# Patient Record
Sex: Female | Born: 1965 | Race: White | Hispanic: No | Smoking: Former smoker
Health system: Southern US, Community
[De-identification: ages and names within clinical notes are randomized; demographics above are authoritative.]

## PROBLEM LIST (undated history)

## (undated) DIAGNOSIS — T753XXA Motion sickness, initial encounter: Secondary | ICD-10-CM

## (undated) DIAGNOSIS — Z972 Presence of dental prosthetic device (complete) (partial): Secondary | ICD-10-CM

## (undated) HISTORY — PX: EYE SURGERY: SHX253

---

## 2009-03-06 ENCOUNTER — Emergency Department (HOSPITAL_BASED_OUTPATIENT_CLINIC_OR_DEPARTMENT_OTHER): Admission: EM | Admit: 2009-03-06 | Discharge: 2009-03-06 | Payer: Self-pay | Admitting: Emergency Medicine

## 2017-11-27 HISTORY — PX: EYE SURGERY: SHX253

## 2021-11-30 ENCOUNTER — Other Ambulatory Visit: Payer: Self-pay

## 2021-11-30 ENCOUNTER — Encounter: Payer: Self-pay | Admitting: Obstetrics and Gynecology

## 2021-11-30 ENCOUNTER — Ambulatory Visit (INDEPENDENT_AMBULATORY_CARE_PROVIDER_SITE_OTHER): Payer: PRIVATE HEALTH INSURANCE | Admitting: Obstetrics and Gynecology

## 2021-11-30 ENCOUNTER — Other Ambulatory Visit (HOSPITAL_COMMUNITY)
Admission: RE | Admit: 2021-11-30 | Discharge: 2021-11-30 | Disposition: A | Discharge status: Home / Self Care | Payer: PRIVATE HEALTH INSURANCE | Source: Ambulatory Visit | Attending: Obstetrics and Gynecology | Admitting: Obstetrics and Gynecology

## 2021-11-30 VITALS — BP 110/65 | HR 71 | Resp 16 | Ht 67.0 in | Wt 140.0 lb

## 2021-11-30 DIAGNOSIS — Z01419 Encounter for gynecological examination (general) (routine) without abnormal findings: Secondary | ICD-10-CM | POA: Diagnosis not present

## 2021-11-30 DIAGNOSIS — Z30432 Encounter for removal of intrauterine contraceptive device: Secondary | ICD-10-CM | POA: Diagnosis not present

## 2021-11-30 NOTE — Progress Notes (Signed)
Subjective:     Bailey Martinez is a 56 y.o. female P1 with BMI 21 who is here for a comprehensive physical exam. The patient reports no problems. She reports amenorrhea for the past 15 years. Patient reports Mirena IUD removed 15 years ago. She has not been sexually active for over 9 years. She denies pelvic pain or abnormal discharge. She denies urinary incontinence. She denies vasomotor symptoms. She reports constipation which she treats with Miralax and laxatives. Patient is without any other complaints  History reviewed. No pertinent past medical history. Past Surgical History:  Procedure Laterality Date   EYE SURGERY Left    Family History  Problem Relation Age of Onset   Kidney disease Father    Arthritis Mother     Social History   Socioeconomic History   Marital status: Divorced    Spouse name: Not on file   Number of children: Not on file   Years of education: Not on file   Highest education level: Not on file  Occupational History   Occupation: Catering  Tobacco Use   Smoking status: Former    Types: Cigarettes   Smokeless tobacco: Not on file  Vaping Use   Vaping Use: Every day   Substances: Nicotine, Flavoring  Substance and Sexual Activity   Alcohol use: Not Currently   Drug use: Never   Sexual activity: Not Currently    Birth control/protection: None  Other Topics Concern   Not on file  Social History Narrative   Not on file   Social Determinants of Health   Financial Resource Strain: Not on file  Food Insecurity: Not on file  Transportation Needs: Not on file  Physical Activity: Not on file  Stress: Not on file  Social Connections: Not on file  Intimate Partner Violence: Not on file   Health Maintenance  Topic Date Due   HIV Screening  Never done   Hepatitis C Screening  Never done   TETANUS/TDAP  Never done   PAP SMEAR-Modifier  Never done   COLONOSCOPY (Pts 45-64yrs Insurance coverage will need to be confirmed)  Never done   MAMMOGRAM  Never  done   Zoster Vaccines- Shingrix (1 of 2) Never done   COVID-19 Vaccine (4 - Booster for Pfizer series) 12/16/2020   INFLUENZA VACCINE  Never done   HPV VACCINES  Aged Out       Review of Systems Pertinent items noted in HPI and remainder of comprehensive ROS otherwise negative.   Objective:  Blood pressure 110/65, pulse 71, resp. rate 16, height 5\' 7"  (1.702 m), weight 140 lb (63.5 kg).   GENERAL: Well-developed, well-nourished female in no acute distress.  HEENT: Normocephalic, atraumatic. Sclerae anicteric.  NECK: Supple. Normal thyroid.  LUNGS: Clear to auscultation bilaterally.  HEART: Regular rate and rhythm. BREASTS: Symmetric in size. No palpable masses or lymphadenopathy, skin changes, or nipple drainage. ABDOMEN: Soft, nontender, nondistended. No organomegaly. PELVIC: Normal external female genitalia. Vagina is pink and rugated.  Normal discharge. Normal appearing cervix with IUD stings visualized at the os. Uterus is normal in size. No adnexal mass or tenderness. Chaperone present during the pelvic exam EXTREMITIES: No cyanosis, clubbing, or edema, 2+ distal pulses.     Assessment:    Healthy female exam.      Plan:    Pap smear collected Patient was unaware that IUD was still in situ for the past 15 years and wonders if it was replaced without her knowledge. IUD was removed today without complications  using a ring forceps Screening mammogram ordered Health maintenance labs ordered Patient referred to GI for colonoscopy and constipation evaluation Patient will be contacted with abnormal results See After Visit Summary for Counseling Recommendations

## 2021-11-30 NOTE — Progress Notes (Signed)
Last pap 10 years ago @ [facility] Never had mammogram

## 2021-12-01 ENCOUNTER — Encounter: Payer: Self-pay | Admitting: Obstetrics and Gynecology

## 2021-12-01 LAB — CBC
HCT: 45.9 % — ABNORMAL HIGH (ref 35.0–45.0)
Hemoglobin: 15.7 g/dL — ABNORMAL HIGH (ref 11.7–15.5)
MCH: 30.2 pg (ref 27.0–33.0)
MCHC: 34.2 g/dL (ref 32.0–36.0)
MCV: 88.3 fL (ref 80.0–100.0)
MPV: 10.1 fL (ref 7.5–12.5)
Platelets: 223 10*3/uL (ref 140–400)
RBC: 5.2 10*6/uL — ABNORMAL HIGH (ref 3.80–5.10)
RDW: 12.1 % (ref 11.0–15.0)
WBC: 5.3 10*3/uL (ref 3.8–10.8)

## 2021-12-01 LAB — LIPID PANEL
Cholesterol: 233 mg/dL — ABNORMAL HIGH (ref <200)
HDL: 88 mg/dL (ref >=50)
LDL Cholesterol (Calc): 126 mg/dL (calc) — ABNORMAL HIGH
Non-HDL Cholesterol (Calc): 145 mg/dL (calc) — ABNORMAL HIGH (ref <130)
Total CHOL/HDL Ratio: 2.6 (calc) (ref <5.0)
Triglycerides: 93 mg/dL (ref <150)

## 2021-12-01 LAB — COMPREHENSIVE METABOLIC PANEL
AG Ratio: 2 (calc) (ref 1.0–2.5)
ALT: 13 U/L (ref 6–29)
AST: 15 U/L (ref 10–35)
Albumin: 4.6 g/dL (ref 3.6–5.1)
Alkaline phosphatase (APISO): 55 U/L (ref 37–153)
BUN: 17 mg/dL (ref 7–25)
CO2: 32 mmol/L (ref 20–32)
Calcium: 10.4 mg/dL (ref 8.6–10.4)
Chloride: 103 mmol/L (ref 98–110)
Creat: 0.77 mg/dL (ref 0.50–1.03)
Globulin: 2.3 g/dL (calc) (ref 1.9–3.7)
Glucose, Bld: 97 mg/dL (ref 65–99)
Potassium: 5.1 mmol/L (ref 3.5–5.3)
Sodium: 143 mmol/L (ref 135–146)
Total Bilirubin: 1.3 mg/dL — ABNORMAL HIGH (ref 0.2–1.2)
Total Protein: 6.9 g/dL (ref 6.1–8.1)

## 2021-12-01 LAB — HEMOGLOBIN A1C
Hgb A1c MFr Bld: 5.4 % of total Hgb (ref <5.7)
Mean Plasma Glucose: 108 mg/dL
eAG (mmol/L): 6 mmol/L

## 2021-12-01 LAB — TSH: TSH: 2.58 mIU/L

## 2021-12-05 LAB — CYTOLOGY - PAP
Comment: NEGATIVE
Diagnosis: NEGATIVE
High risk HPV: NEGATIVE

## 2021-12-20 ENCOUNTER — Ambulatory Visit (INDEPENDENT_AMBULATORY_CARE_PROVIDER_SITE_OTHER): Payer: PRIVATE HEALTH INSURANCE

## 2021-12-20 ENCOUNTER — Other Ambulatory Visit: Payer: Self-pay

## 2021-12-20 DIAGNOSIS — Z01419 Encounter for gynecological examination (general) (routine) without abnormal findings: Secondary | ICD-10-CM

## 2021-12-20 DIAGNOSIS — Z1231 Encounter for screening mammogram for malignant neoplasm of breast: Secondary | ICD-10-CM

## 2022-07-18 ENCOUNTER — Encounter: Payer: Self-pay | Admitting: Ophthalmology

## 2022-07-24 NOTE — Discharge Instructions (Signed)

## 2022-07-25 ENCOUNTER — Ambulatory Visit
Admission: RE | Admit: 2022-07-25 | Discharge: 2022-07-25 | Disposition: A | Discharge status: Home / Self Care | Payer: 59 | Attending: Ophthalmology | Admitting: Ophthalmology

## 2022-07-25 ENCOUNTER — Encounter: Payer: Self-pay | Admitting: Ophthalmology

## 2022-07-25 ENCOUNTER — Ambulatory Visit: Payer: 59 | Admitting: Anesthesiology

## 2022-07-25 ENCOUNTER — Other Ambulatory Visit: Payer: Self-pay

## 2022-07-25 ENCOUNTER — Encounter
Admission: RE | Disposition: A | Discharge status: Home / Self Care | Payer: Self-pay | Source: Home / Self Care | Attending: Ophthalmology

## 2022-07-25 DIAGNOSIS — F17290 Nicotine dependence, other tobacco product, uncomplicated: Secondary | ICD-10-CM | POA: Diagnosis not present

## 2022-07-25 DIAGNOSIS — H268 Other specified cataract: Secondary | ICD-10-CM | POA: Diagnosis present

## 2022-07-25 HISTORY — PX: CATARACT EXTRACTION W/PHACO: SHX586

## 2022-07-25 HISTORY — DX: Motion sickness, initial encounter: T75.3XXA

## 2022-07-25 HISTORY — DX: Presence of dental prosthetic device (complete) (partial): Z97.2

## 2022-07-25 SURGERY — PHACOEMULSIFICATION, CATARACT, WITH IOL INSERTION
Anesthesia: Monitor Anesthesia Care | Site: Eye | Laterality: Right

## 2022-07-25 MED ORDER — SIGHTPATH DOSE#1 BSS IO SOLN
INTRAOCULAR | Status: DC | PRN
  Administered 2022-07-25: 57 mL via OPHTHALMIC

## 2022-07-25 MED ORDER — FENTANYL CITRATE (PF) 100 MCG/2ML IJ SOLN
INTRAMUSCULAR | Status: DC | PRN
  Administered 2022-07-25: 50 ug via INTRAVENOUS
  Administered 2022-07-25 (×2): 25 ug via INTRAVENOUS

## 2022-07-25 MED ORDER — ARMC OPHTHALMIC DILATING DROPS
1.0000 | OPHTHALMIC | Status: DC | PRN
  Administered 2022-07-25 (×3): 1 via OPHTHALMIC

## 2022-07-25 MED ORDER — TETRACAINE HCL 0.5 % OP SOLN
1.0000 [drp] | OPHTHALMIC | Status: DC | PRN
  Administered 2022-07-25 (×3): 1 [drp] via OPHTHALMIC

## 2022-07-25 MED ORDER — BRIMONIDINE TARTRATE-TIMOLOL 0.2-0.5 % OP SOLN
OPHTHALMIC | Status: DC | PRN
  Administered 2022-07-25: 1 [drp] via OPHTHALMIC

## 2022-07-25 MED ORDER — TRYPAN BLUE 0.06 % IO SOSY
PREFILLED_SYRINGE | INTRAOCULAR | Status: DC | PRN
  Administered 2022-07-25: 0.5 mL via INTRAOCULAR

## 2022-07-25 MED ORDER — MIDAZOLAM HCL 2 MG/2ML IJ SOLN
INTRAMUSCULAR | Status: DC | PRN
  Administered 2022-07-25: 1 mg via INTRAVENOUS

## 2022-07-25 MED ORDER — SIGHTPATH DOSE#1 BSS IO SOLN
INTRAOCULAR | Status: DC | PRN
  Administered 2022-07-25: 15 mL via INTRAOCULAR

## 2022-07-25 MED ORDER — LACTATED RINGERS IV SOLN: INTRAVENOUS | Status: DC

## 2022-07-25 MED ORDER — ONDANSETRON HCL 4 MG/2ML IJ SOLN
INTRAMUSCULAR | Status: DC | PRN
  Administered 2022-07-25: 4 mg via INTRAVENOUS

## 2022-07-25 MED ORDER — SIGHTPATH DOSE#1 NA HYALUR & NA CHOND-NA HYALUR IO KIT
PACK | INTRAOCULAR | Status: DC | PRN
  Administered 2022-07-25: 1 via OPHTHALMIC

## 2022-07-25 MED ORDER — SODIUM HYALURONATE 23MG/ML IO SOSY
PREFILLED_SYRINGE | INTRAOCULAR | Status: DC | PRN
  Administered 2022-07-25: 0.6 mL via INTRAOCULAR

## 2022-07-25 MED ORDER — SIGHTPATH DOSE#1 BSS IO SOLN
INTRAOCULAR | Status: DC | PRN
  Administered 2022-07-25: 2 mL

## 2022-07-25 MED ORDER — CEFUROXIME OPHTHALMIC INJECTION 1 MG/0.1 ML
INJECTION | OPHTHALMIC | Status: DC | PRN
  Administered 2022-07-25: 0.1 mL via INTRACAMERAL

## 2022-07-25 SURGICAL SUPPLY — 17 items
CANNULA ANT/CHMB 27G (MISCELLANEOUS) IMPLANT
CANNULA ANT/CHMB 27GA (MISCELLANEOUS) ×1 IMPLANT
CATARACT SUITE SIGHTPATH (MISCELLANEOUS) ×1 IMPLANT
FEE CATARACT SUITE SIGHTPATH (MISCELLANEOUS) ×1 IMPLANT
GLOVE SRG 8 PF TXTR STRL LF DI (GLOVE) ×1 IMPLANT
GLOVE SURG ENC TEXT LTX SZ7.5 (GLOVE) ×1 IMPLANT
GLOVE SURG UNDER POLY LF SZ8 (GLOVE) ×1
LENS CLAREON WAGON WHEEL 21.5 (Intraocular Lens) ×1 IMPLANT
LENS IOL CLRN WGN WHL 21.5 (Intraocular Lens) IMPLANT
NDL FILTER BLUNT 18X1 1/2 (NEEDLE) ×1 IMPLANT
NEEDLE FILTER BLUNT 18X1 1/2 (NEEDLE) ×1 IMPLANT
SOL BAL SALT 15ML (MISCELLANEOUS) ×1
SOLUTION BAL SALT 15ML (MISCELLANEOUS) IMPLANT
SUT ETHILON 10-0 CS-B-6CS-B-6 (SUTURE)
SUTURE EHLN 10-0 CS-B-6CS-B-6 (SUTURE) IMPLANT
SYR 3ML LL SCALE MARK (SYRINGE) ×1 IMPLANT
WATER STERILE IRR 250ML POUR (IV SOLUTION) ×1 IMPLANT

## 2022-07-25 NOTE — Anesthesia Postprocedure Evaluation (Signed)
Anesthesia Post Note  Patient: Cheray Pardi  Procedure(s) Performed: CATARACT EXTRACTION PHACO AND INTRAOCULAR LENS PLACEMENT (IOC) RIGHT VISION BLUE, HEALON 5 6.35 00:48.1 (Right: Eye)  Patient location during evaluation: PACU Anesthesia Type: MAC Level of consciousness: awake and alert, oriented and patient cooperative Pain management: pain level controlled Vital Signs Assessment: post-procedure vital signs reviewed and stable Respiratory status: spontaneous breathing, nonlabored ventilation and respiratory function stable Cardiovascular status: blood pressure returned to baseline and stable Postop Assessment: adequate PO intake Anesthetic complications: no   There were no known notable events for this encounter.   Last Vitals:  Vitals:   07/25/22 1346 07/25/22 1352  BP: 113/88 (!) 127/90  Pulse:  78  Resp:  11  Temp: (!) 36.3 C (!) 36.3 C  SpO2:  96%    Last Pain:  Vitals:   07/25/22 1352  PainSc: 0-No pain                 Darrin Nipper

## 2022-07-25 NOTE — Transfer of Care (Signed)
Immediate Anesthesia Transfer of Care Note  Patient: Bailey Martinez  Procedure(s) Performed: CATARACT EXTRACTION PHACO AND INTRAOCULAR LENS PLACEMENT (IOC) RIGHT VISION BLUE, HEALON 5 6.35 00:48.1 (Right: Eye)  Patient Location: PACU  Anesthesia Type: MAC  Level of Consciousness: awake, alert  and patient cooperative  Airway and Oxygen Therapy: Patient Spontanous Breathing and Patient connected to supplemental oxygen  Post-op Assessment: Post-op Vital signs reviewed, Patient's Cardiovascular Status Stable, Respiratory Function Stable, Patent Airway and No signs of Nausea or vomiting  Post-op Vital Signs: Reviewed and stable  Complications: There were no known notable events for this encounter.

## 2022-07-25 NOTE — Op Note (Signed)
OPERATIVE NOTE  Bailey Martinez [id number] 07/25/2022   PREOPERATIVE DIAGNOSIS:  H25.11 Cataract            Mature (Total) Cataract Right Eye H25.89   POSTOPERATIVE DIAGNOSIS: same          PROCEDURE:  Phacoemusification with posterior chamber intraocular lens placement of the right eye .  Vision Blue dye was used to stain the lens capsule.  LENS:   Implant Name Type Inv. Item Serial No. Manufacturer Lot No. LRB No. Used Action  LENS CLAREON WAGON WHEEL 21.5 - [id number] Intraocular Lens LENS CLAREON WAGON WHEEL 21.5 [id number] [employer]  Right 1 Implanted      [id number]  ULTRASOUND TIME:  0 minutes 48 seconds, CDE 6.4  SURGEON:  Wyonia Hough, MD   ANESTHESIA:  Topical with tetracaine drops augmented with 1% preservative-free intracameral lidocaine.   COMPLICATIONS:  None.   DESCRIPTION OF PROCEDURE: The patient was identified in the holding room and transported to the operating room and placed in the supine position under the operating microscope.  The right eye was identified as the operative eye and it was prepped and draped in the usual sterile ophthalmic fashion.  A 1 millimeter clear-corneal paracentesis was made at the 12:00 position.  0.5 ml of preservative-free 1% lidocaine was injected into the anterior chamber. The anterior chamber was filled with Healon 5 viscoelastic.    Vision Blue dye was then injected under the viscoelastic to stain the lens capsule.  BSS was then used to wash the dye out.  Additional Healon 5 was placed into the anterior chamber. A 2.4 millimeter keratome was used to make a near-clear corneal incision at the 9:00 position. A curvilinear capsulorrhexis was made with a cystotome and capsulorrhexis forceps.  Balanced salt solution was used to hydrodissect and hydrodelineate the nucleus.  Viscoat was then placed in the anterior chamber.   Phacoemulsification was then used in stop and chop fashion to remove the lens nucleus and epinucleus.  The  remaining cortex was then removed using the irrigation and aspiration handpiece. Provisc was then placed into the capsular bag to distend it for lens placement.  A 21.5 -diopter lens was then injected into the capsular bag.  The remaining viscoelastic was aspirated.   Wounds were hydrated with balanced salt solution.  The anterior chamber was inflated to a physiologic pressure with balanced salt solution. Cefuroxime 0.1 ml of a 10mg /ml solution was injected into the anterior chamber for a dose of 1 mg of intracameral antibiotic at the completion of the case.  No wound leaks were noted.  Topical Timolol and Brimonidine drops were applied to the eye.  The patient was taken to the recovery room in stable condition without complications of anesthesia or surgery.  Gillian Kluever 07/25/2022, 1:44 PM

## 2022-07-25 NOTE — Anesthesia Preprocedure Evaluation (Addendum)
Anesthesia Evaluation  Patient identified by MRN, date of birth, ID band Patient awake    Reviewed: Allergy & Precautions, NPO status , Patient's Chart, lab work & pertinent test results  History of Anesthesia Complications Negative for: history of anesthetic complications  Airway Mallampati: I   Neck ROM: Full    Dental  (+) Partial Lower   Pulmonary former smoker (quit 2019),    Pulmonary exam normal breath sounds clear to auscultation       Cardiovascular Exercise Tolerance: Good negative cardio ROS Normal cardiovascular exam Rhythm:Regular Rate:Normal     Neuro/Psych Uses kratom    GI/Hepatic negative GI ROS,   Endo/Other  negative endocrine ROS  Renal/GU negative Renal ROS     Musculoskeletal   Abdominal   Peds  Hematology negative hematology ROS (+)   Anesthesia Other Findings   Reproductive/Obstetrics                            Anesthesia Physical Anesthesia Plan  ASA: 2  Anesthesia Plan: MAC   Post-op Pain Management:    Induction: Intravenous  PONV Risk Score and Plan: 2 and Treatment may vary due to age or medical condition, Midazolam and TIVA  Airway Management Planned: Natural Airway and Nasal Cannula  Additional Equipment:   Intra-op Plan:   Post-operative Plan:   Informed Consent: I have reviewed the patients History and Physical, chart, labs and discussed the procedure including the risks, benefits and alternatives for the proposed anesthesia with the patient or authorized representative who has indicated his/her understanding and acceptance.     Dental advisory given  Plan Discussed with: CRNA  Anesthesia Plan Comments: (LMA/GETA backup discussed.  Patient consented for risks of anesthesia including but not limited to:  - adverse reactions to medications - damage to eyes, teeth, lips or other oral mucosa - nerve damage due to positioning  - sore  throat or hoarseness - damage to heart, brain, nerves, lungs, other parts of body or loss of life  Informed patient about role of CRNA in peri- and intra-operative care.  Patient voiced understanding.)        Anesthesia Quick Evaluation
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
## 2022-07-26 ENCOUNTER — Encounter: Payer: Self-pay | Admitting: Ophthalmology

## 2023-10-31 IMAGING — MG MM DIGITAL SCREENING BILAT W/ TOMO AND CAD
8 series · 9 of 24 positions shown · non-contrast
Comparison: None.

CLINICAL DATA: Screening.

EXAM:
DIGITAL SCREENING BILATERAL MAMMOGRAM WITH TOMOSYNTHESIS AND CAD
TECHNIQUE: Bilateral screening digital craniocaudal and mediolateral oblique
mammograms were obtained. Bilateral screening digital breast
tomosynthesis was performed. The images were evaluated with
computer-aided detection.

[R MLO synth-2D]
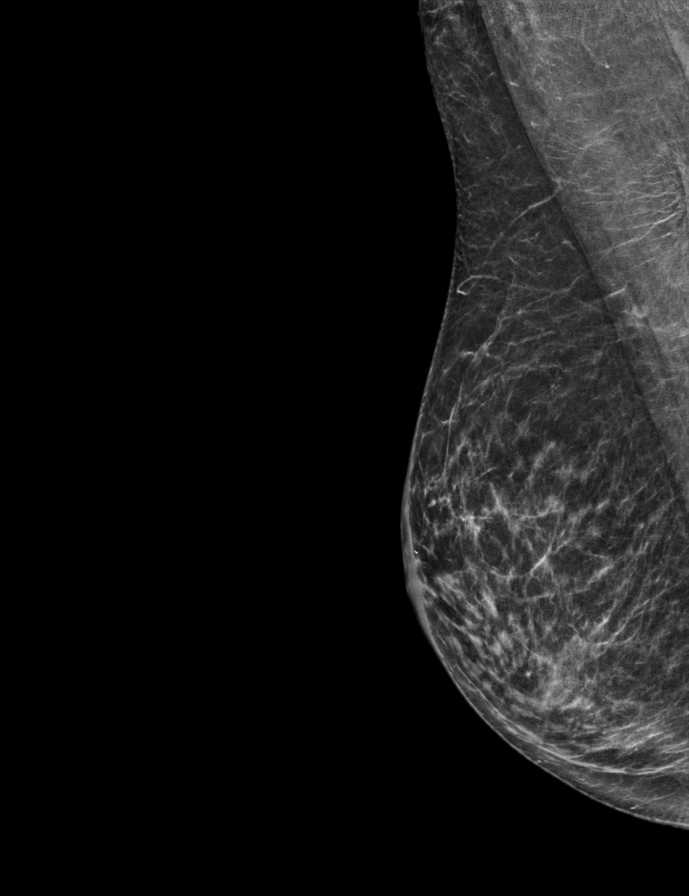

[L MLO synth-2D]
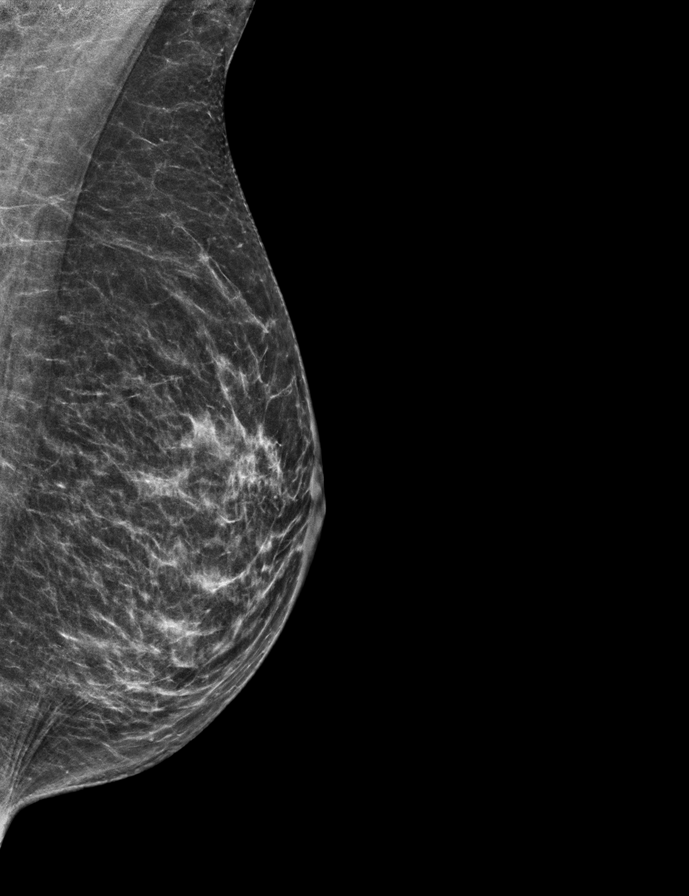

[L CC synth-2D]
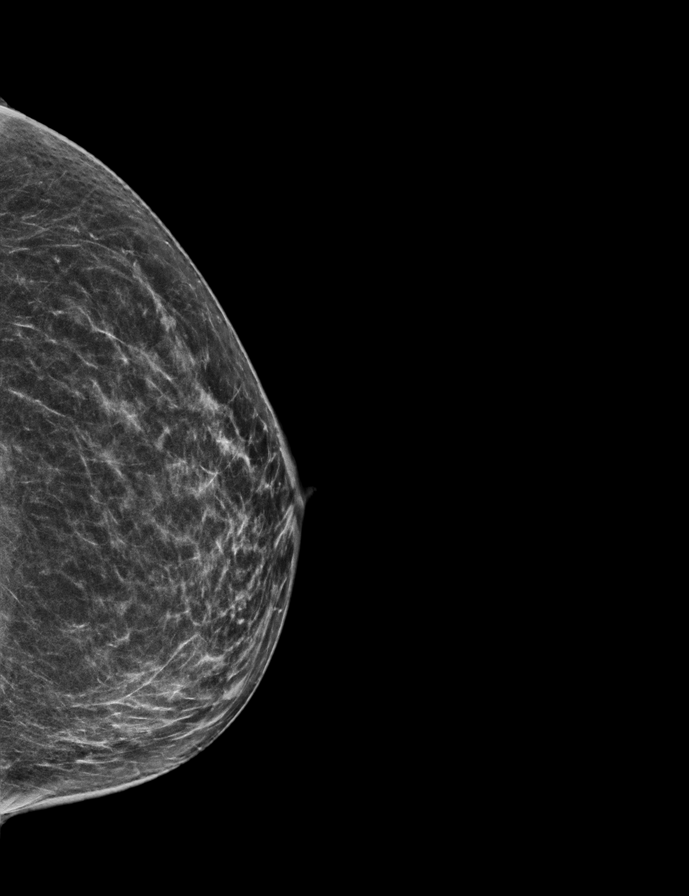

[R CC synth-2D]
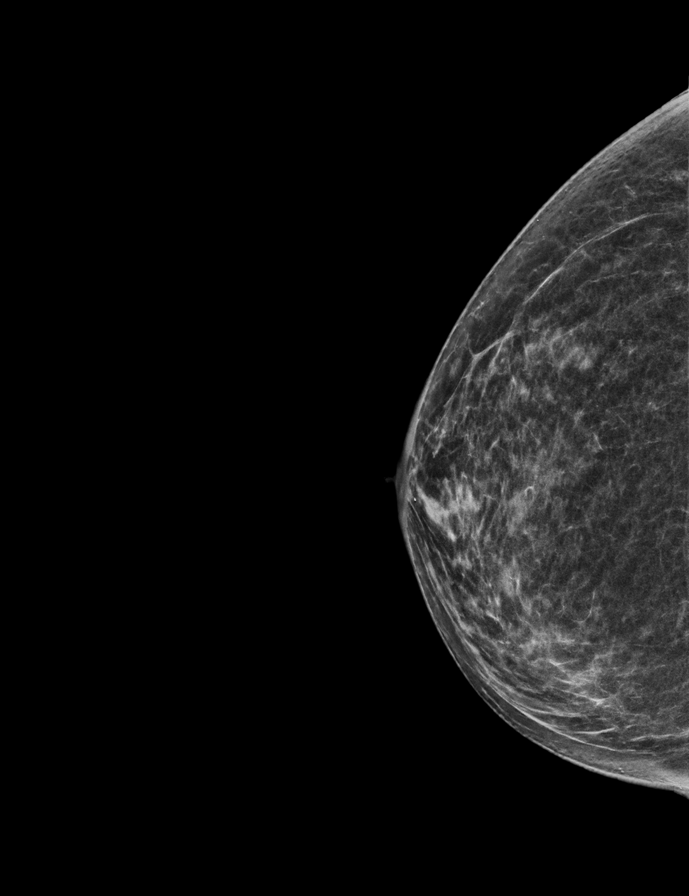

[R CC tomo · 2 of 55 frames shown]
[frame 18/55]
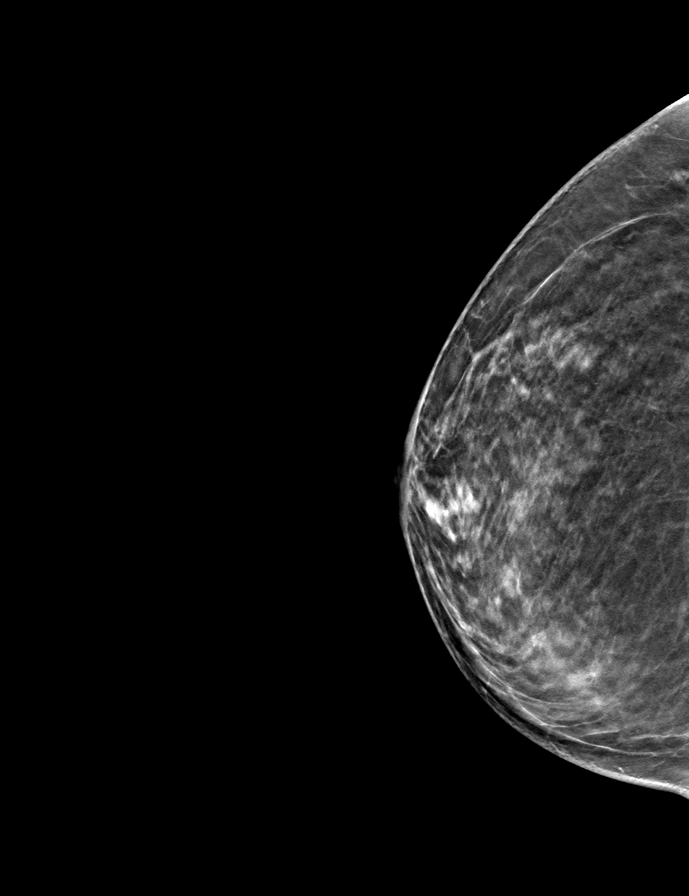
[frame 28/55]
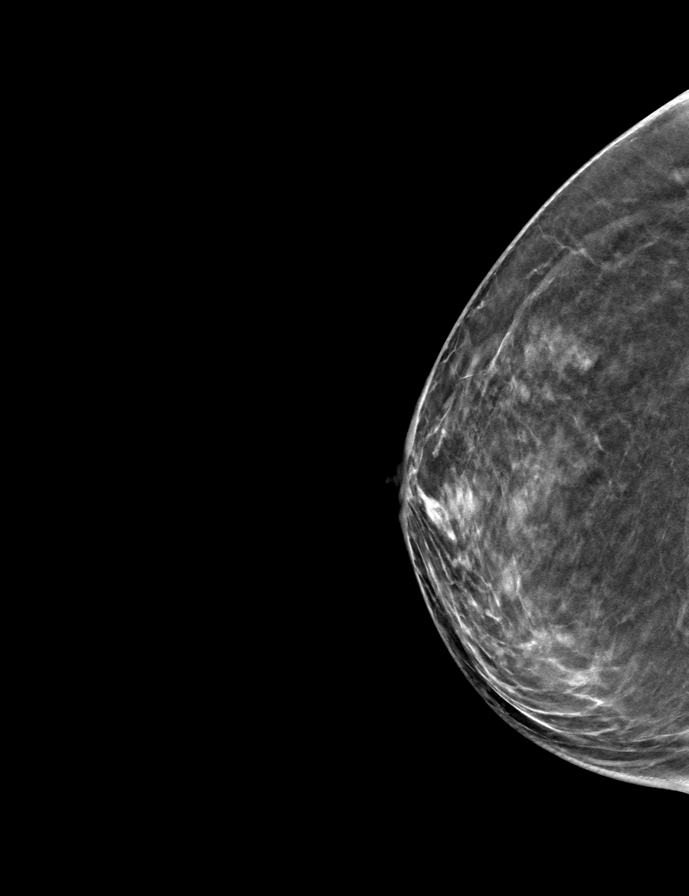

[L CC tomo · tomo slice 28/55.0]
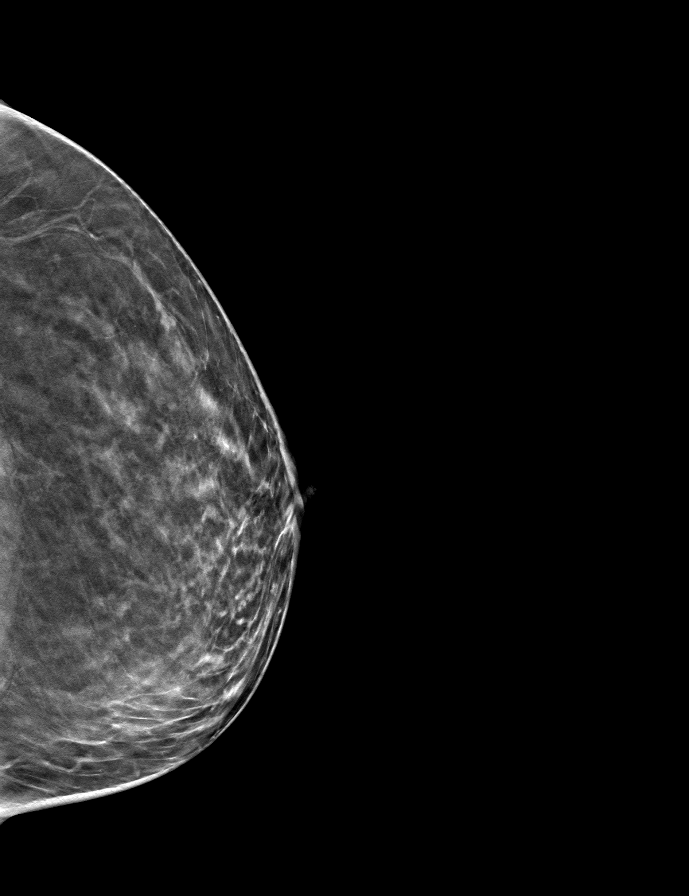

[R MLO tomo · tomo slice 27/54.0]
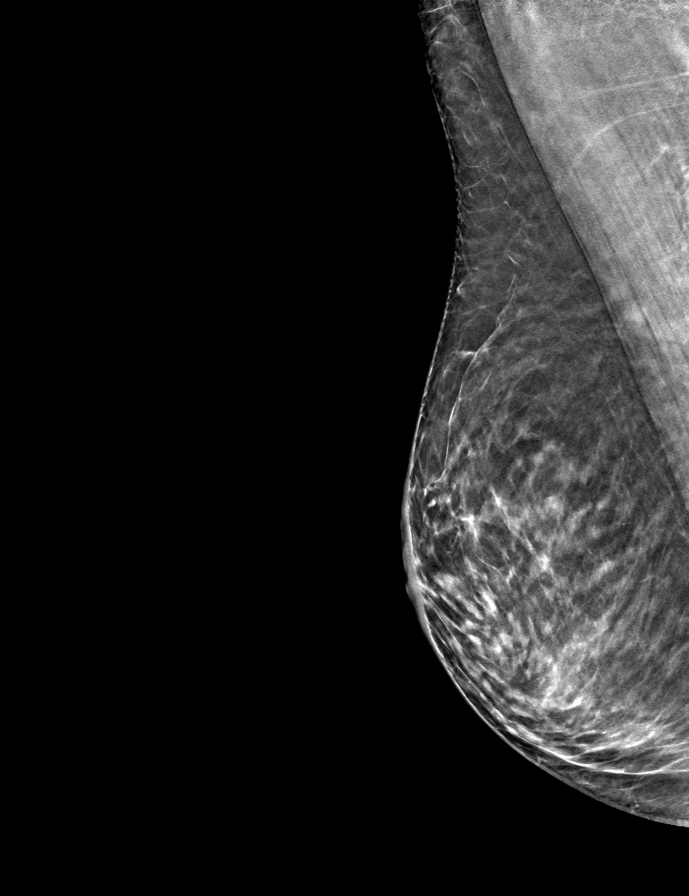

[L MLO tomo · tomo slice 28/55.0]
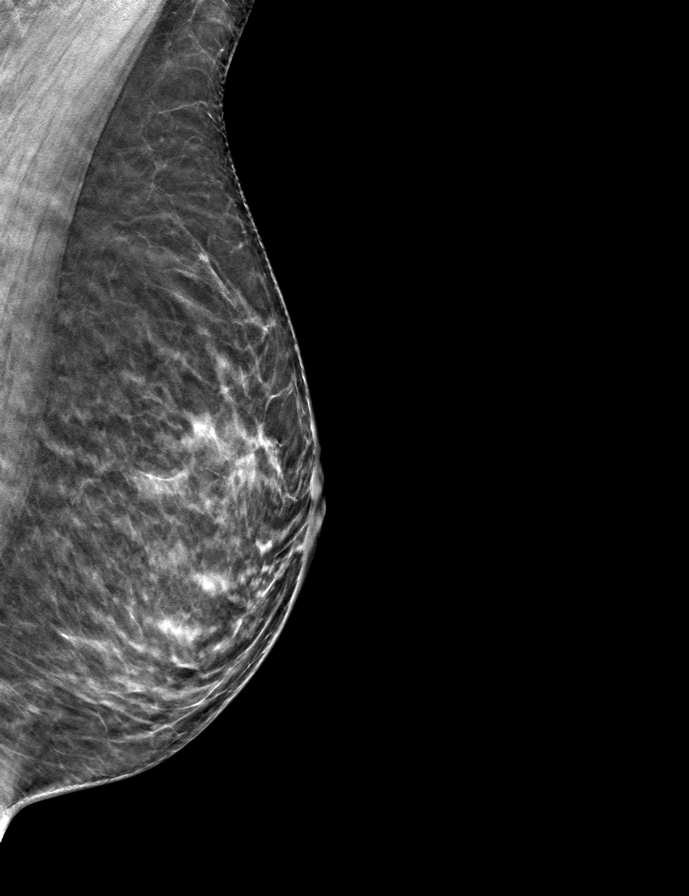

[9 of 24 positions shown; findings below may reference images not displayed]

ACR Breast Density Category b: There are scattered areas of
fibroglandular density.
FINDINGS: There are no findings suspicious for malignancy.
IMPRESSION: No mammographic evidence of malignancy. A result letter of this
screening mammogram will be mailed directly to the patient.

RECOMMENDATION:
Screening mammogram in one year. (Code:XG-X-X7B)

BI-RADS CATEGORY  1: Negative.
